# Patient Record
Sex: Female | Born: 1995 | Race: White | Hispanic: No | Marital: Single | Smoking: Never smoker
Health system: Southern US, Community
[De-identification: ages and names within clinical notes are randomized; demographics above are authoritative.]

## PROBLEM LIST (undated history)

## (undated) DIAGNOSIS — Z973 Presence of spectacles and contact lenses: Secondary | ICD-10-CM

## (undated) DIAGNOSIS — N390 Urinary tract infection, site not specified: Secondary | ICD-10-CM

## (undated) DIAGNOSIS — N201 Calculus of ureter: Secondary | ICD-10-CM

---
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
## 2014-08-18 IMAGING — CT CT ABD-PELV W/O CM
2 of 4 series · 16 of 46 positions shown, 18 images · non-contrast
Comparison: None.

CLINICAL DATA: 17-year-old female left flank pain nausea and
vomiting. Initial encounter.

EXAM:
CT ABDOMEN AND PELVIS WITHOUT CONTRAST
TECHNIQUE: Multidetector CT imaging of the abdomen and pelvis was performed
following the standard protocol without intravenous contrast.

[Series 2: abdomen/pelvis w/o contrast · axial · non-contrast · 0.59mm/px · z∈[-422,-62]mm · 13 of 78 slices shown, 15 images]
[im 3/78  soft-tissue]
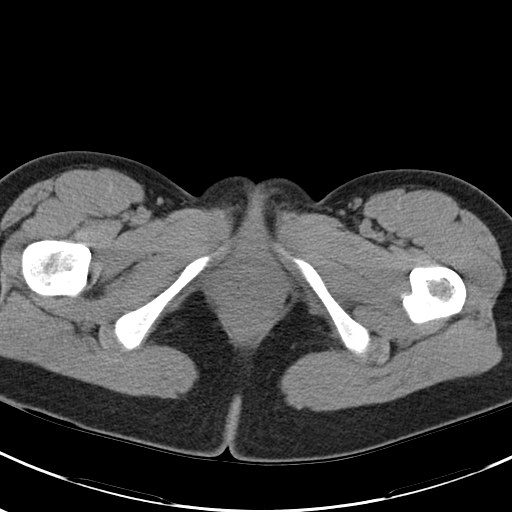
[im 3/78  bone]
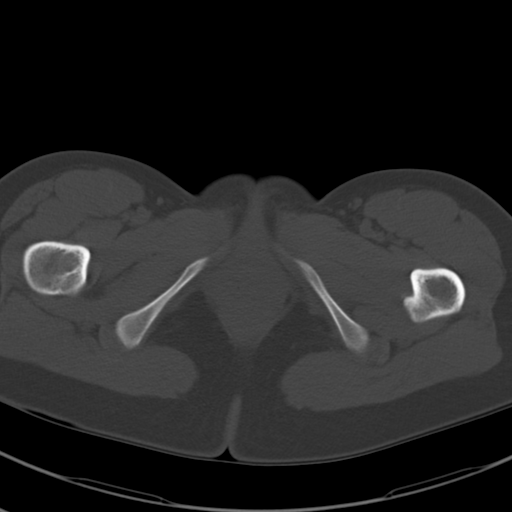
[im 9/78  soft-tissue]
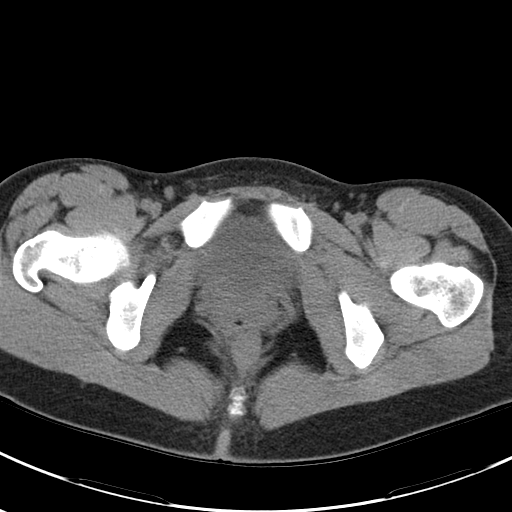
[im 15/78  soft-tissue]
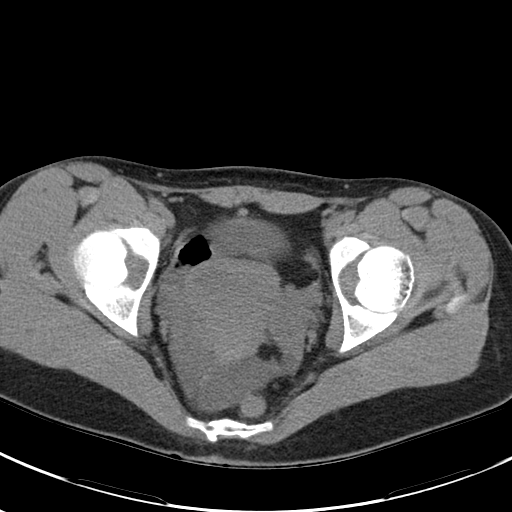
[im 21/78  soft-tissue]
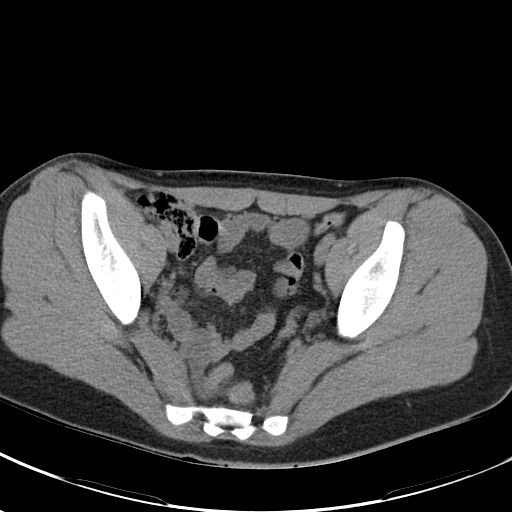
[im 27/78  soft-tissue]
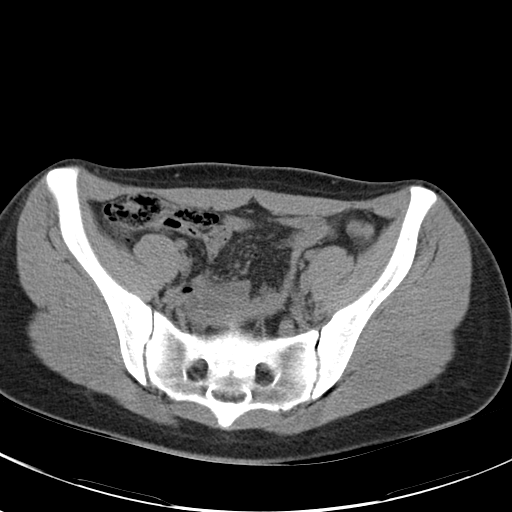
[im 33/78  soft-tissue]
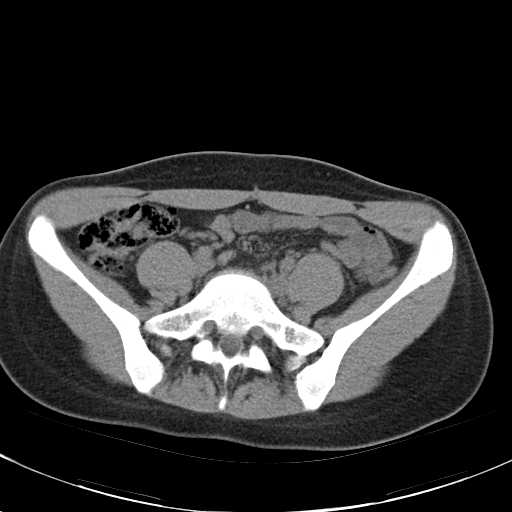
[im 39/78  soft-tissue]
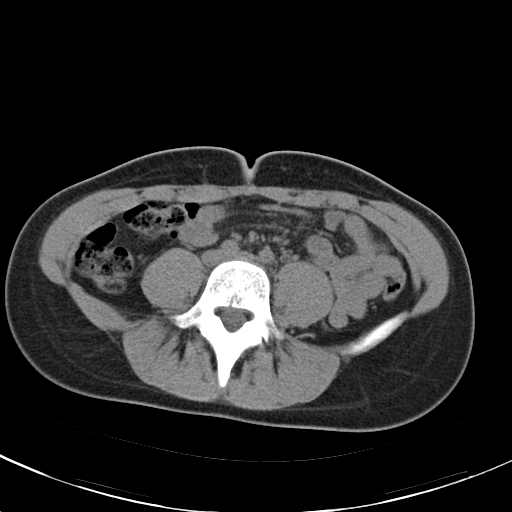
[im 45/78  soft-tissue]
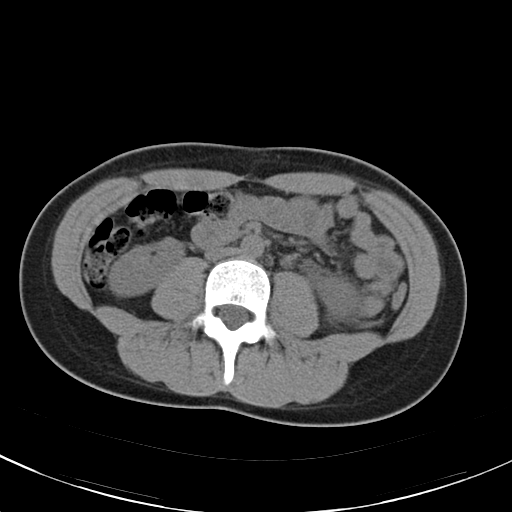
[im 51/78  soft-tissue]
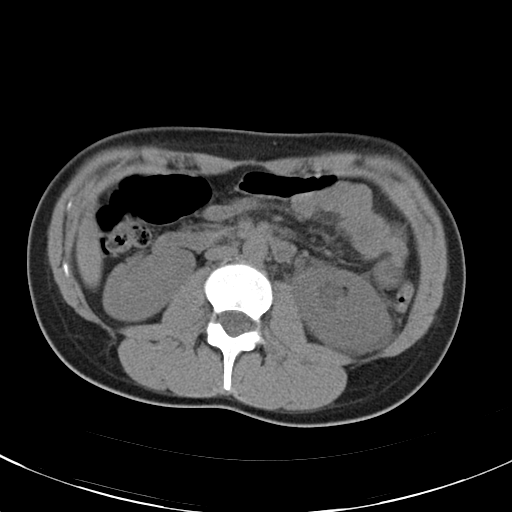
[im 51/78  bone]
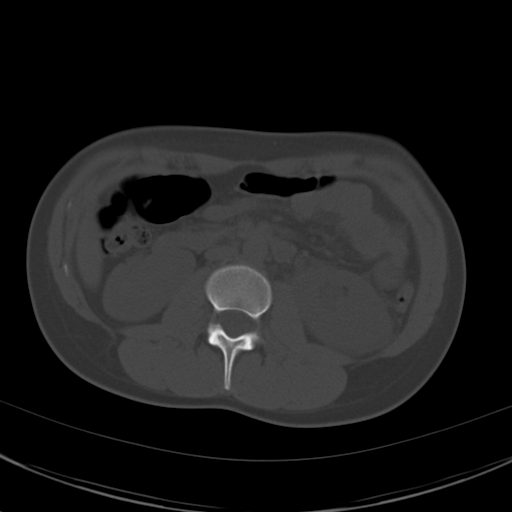
[im 57/78  soft-tissue]
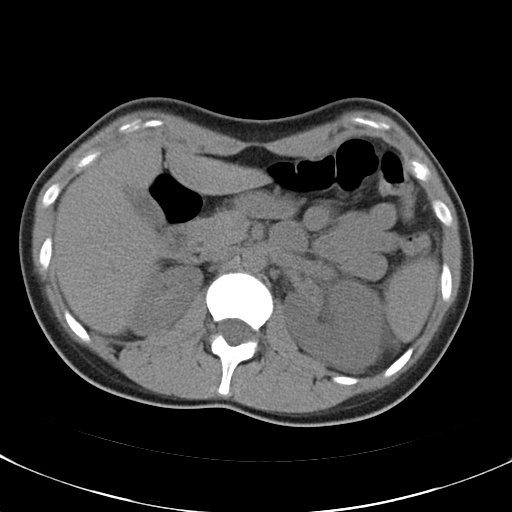
[im 63/78  soft-tissue]
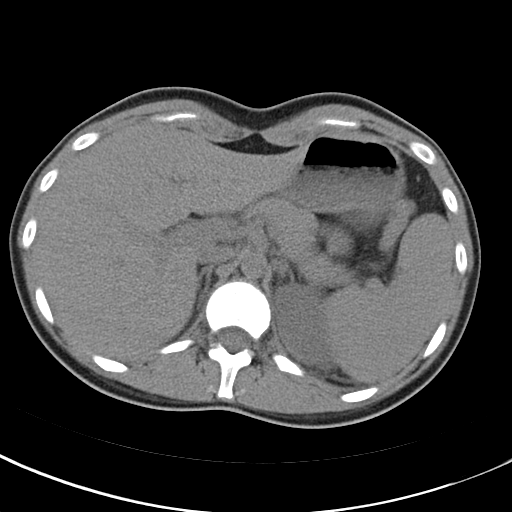
[im 69/78  soft-tissue]
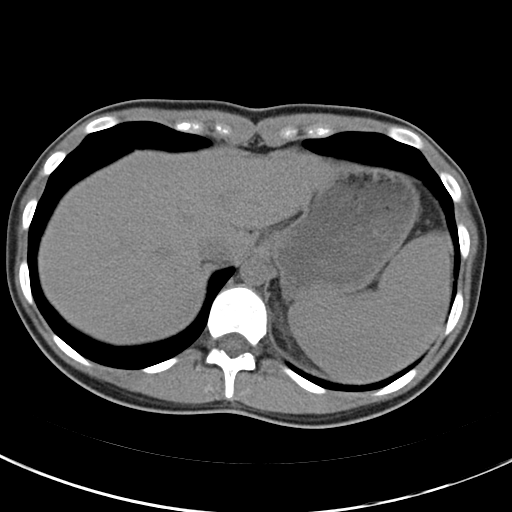
[im 75/78  soft-tissue]
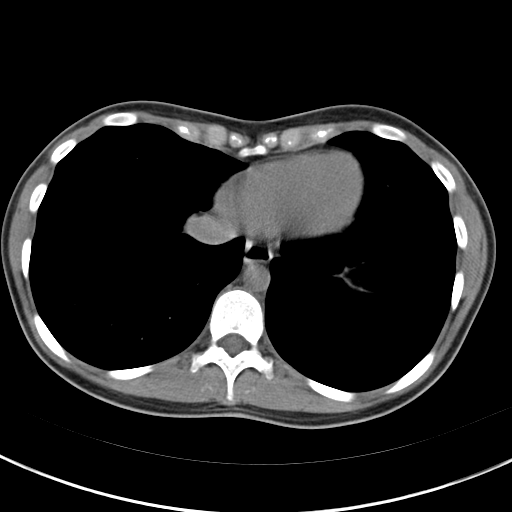

[Series 4: mpr cor (id) · coronal · 0.55mm/px · 3 of 61 slices shown]
[im 21/61  soft-tissue]
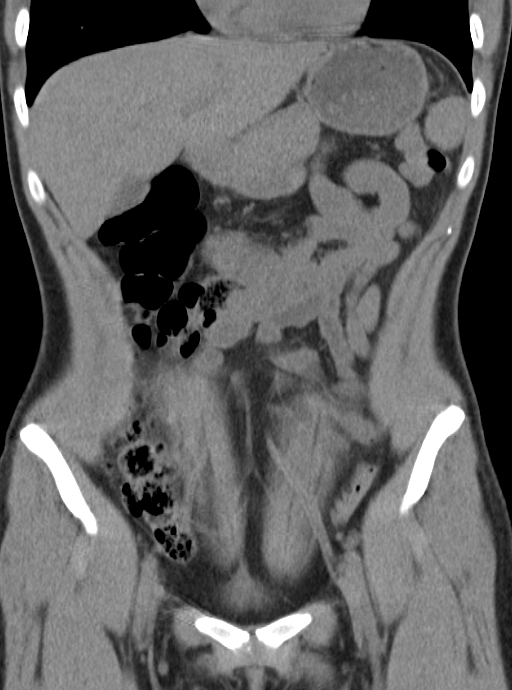
[im 27/61  soft-tissue]
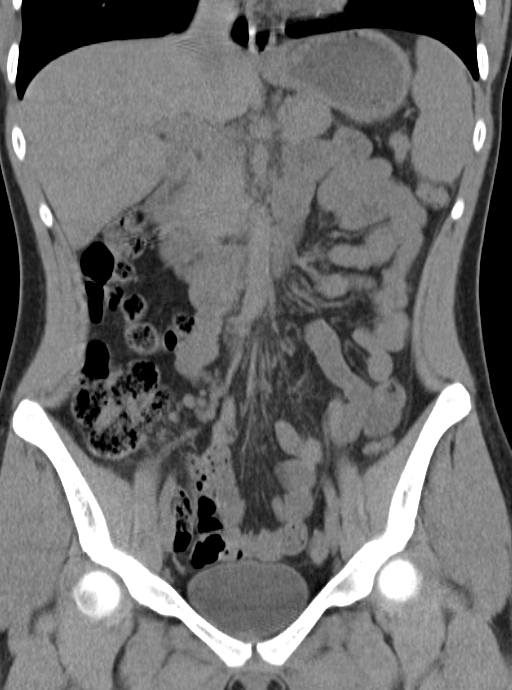
[im 34/61  soft-tissue]
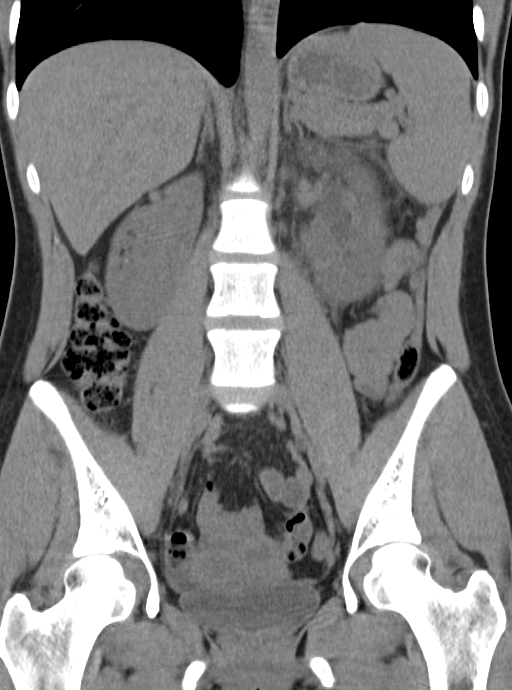

[16 of 46 positions shown; findings below may reference images not displayed]

FINDINGS: Negative lung bases.  No pericardial or pleural effusion.

No osseous abnormality identified.

Small to moderate volume of pelvic free fluid, mostly in the
cul-de-sac. Decompressed and negative rectum. Negative non contrast
uterus and adnexa.

Decompressed and negative sigmoid colon and descending colon. Mildly
redundant but otherwise negative transverse colon. Normal right
colon. Normal appendix along the right pelvic sidewall. No dilated
small bowel. Negative non contrast stomach, duodenum, liver,
gallbladder, spleen, pancreas and adrenal glands.

Mild congenital malrotation of the right kidney which is effacing
laterally. There is a 5 mm mid to lower pole calculus. No right
hydronephrosis, perinephric stranding, or right hydroureter.

Moderate to severe left nephromegaly, perinephric stranding, and
hydronephrosis with left hydroureter and periureteral stranding.
Small volume left para renal fluid. The left ureter is dilated into
the pelvis and to the level of an irregular shaped 4 x 5 mm calculus
located just proximal to the left UVJ. No other urologic calculus
identified. The bladder is unremarkable.
IMPRESSION: 1. Severe obstructive uropathy on the left with a 4 x 5 mm calculus
located about 15 mm proximal to the left UVJ. Appearance suspicious
for forniceal rupture.
2. Right nephrolithiasis.
3. Pelvic free fluid, may be a combination of physiologic and
related to #1.
